# Patient Record
Sex: Female | Born: 1960 | Marital: Married | Smoking: Never smoker
Health system: Southern US, Community
[De-identification: ages and names within clinical notes are randomized; demographics above are authoritative.]

## PROBLEM LIST (undated history)

## (undated) DIAGNOSIS — R51 Headache: Secondary | ICD-10-CM

## (undated) DIAGNOSIS — E8881 Metabolic syndrome: Secondary | ICD-10-CM

## (undated) DIAGNOSIS — M5410 Radiculopathy, site unspecified: Secondary | ICD-10-CM

## (undated) DIAGNOSIS — T8859XA Other complications of anesthesia, initial encounter: Secondary | ICD-10-CM

## (undated) DIAGNOSIS — Z973 Presence of spectacles and contact lenses: Secondary | ICD-10-CM

## (undated) DIAGNOSIS — R519 Headache, unspecified: Secondary | ICD-10-CM

## (undated) DIAGNOSIS — E88819 Insulin resistance, unspecified: Secondary | ICD-10-CM

## (undated) DIAGNOSIS — T4145XA Adverse effect of unspecified anesthetic, initial encounter: Secondary | ICD-10-CM

## (undated) HISTORY — PX: ABDOMINAL HYSTERECTOMY: SHX81

## (undated) HISTORY — PX: COLONOSCOPY: SHX174

## (undated) HISTORY — PX: TONSILLECTOMY: SUR1361

## (undated) HISTORY — PX: DILATION AND CURETTAGE OF UTERUS: SHX78

## (undated) HISTORY — PX: COLON SURGERY: SHX602

## (undated) HISTORY — PX: APPENDECTOMY: SHX54

---

## 2016-11-12 ENCOUNTER — Ambulatory Visit: Payer: Self-pay | Admitting: Physician Assistant

## 2016-11-25 NOTE — Pre-Procedure Instructions (Signed)
    Deborah Molina  11/25/2016      [facility] [phone] - [place], [place] - [address] AT [facility] [address] [name] [id number] Phone: [phone] Fax: [phone]  [facility] - [place], [place] - [address] [place] [id number] Phone: [phone] Fax: [phone]    Your procedure is scheduled on Thursday, December 03, 2016  [facility] Admitting at 5:30 A.M.  Call this number if you have problems the morning of surgery:  [phone]   Remember:  Do not eat food or drink liquids after midnight Wednesday, December 02, 2016  Take these medicines the morning of surgery with A SIP OF WATER :estradiol (ESTRACE), Singulair, Thyroid Stop taking Aspirin, vitamins, fish oil, and herbal medications (Coenzyme Q10,  "a Drenal, " K2 MK-7 MAX,  Berberine, Ashwagandha, Hemp Flower Extract, Pregnenolone, Bromelain  ).  Do not take any NSAIDs ie: Ibuprofen, Advil, Naproxen (Aleve), Motrin, BC and Goody Powder or any medication containing Aspirin; stop now.  Do not wear jewelry, make-up or nail polish.  Do not wear lotions, powders, or perfumes, or deoderant.  Do not shave 48 hours prior to surgery.    Do not bring valuables to the hospital.  [facility] is not responsible for any belongings or valuables.  Contacts, dentures or bridgework may not be worn into surgery.  Leave your suitcase in the car.  After surgery it may be brought to your room. For patients admitted to the hospital, discharge time will be determined by your treatment team. Patients discharged the day of surgery will not be allowed to drive home.  Special instructions: Shower the night before surgery and the morning of surgery with CHG. Please read over the following fact sheets that you were given. Pain Booklet, Coughing and Deep Breathing, MRSA Information and Surgical Site Infection Prevention

## 2016-11-26 ENCOUNTER — Encounter (HOSPITAL_COMMUNITY): Payer: Self-pay

## 2016-11-26 ENCOUNTER — Encounter (HOSPITAL_COMMUNITY)
Admission: RE | Admit: 2016-11-26 | Discharge: 2016-11-26 | Disposition: A | Discharge status: Home / Self Care | Payer: BLUE CROSS/BLUE SHIELD | Source: Ambulatory Visit | Attending: Orthopedic Surgery | Admitting: Orthopedic Surgery

## 2016-11-26 DIAGNOSIS — Z01812 Encounter for preprocedural laboratory examination: Secondary | ICD-10-CM | POA: Diagnosis not present

## 2016-11-26 DIAGNOSIS — Z01810 Encounter for preprocedural cardiovascular examination: Secondary | ICD-10-CM | POA: Diagnosis not present

## 2016-11-26 DIAGNOSIS — E8881 Metabolic syndrome: Secondary | ICD-10-CM | POA: Diagnosis not present

## 2016-11-26 HISTORY — DX: Presence of spectacles and contact lenses: Z97.3

## 2016-11-26 HISTORY — DX: Other complications of anesthesia, initial encounter: T88.59XA

## 2016-11-26 HISTORY — DX: Headache: R51

## 2016-11-26 HISTORY — DX: Metabolic syndrome: E88.81

## 2016-11-26 HISTORY — DX: Radiculopathy, site unspecified: M54.10

## 2016-11-26 HISTORY — DX: Insulin resistance, unspecified: E88.819

## 2016-11-26 HISTORY — DX: Hereditary hemochromatosis: E83.110

## 2016-11-26 HISTORY — DX: Headache, unspecified: R51.9

## 2016-11-26 HISTORY — DX: Adverse effect of unspecified anesthetic, initial encounter: T41.45XA

## 2016-11-26 LAB — CBC
HCT: 43.4 % (ref 36.0–46.0)
HEMOGLOBIN: 14.2 g/dL (ref 12.0–15.0)
MCH: 30 pg (ref 26.0–34.0)
MCHC: 32.7 g/dL (ref 30.0–36.0)
MCV: 91.6 fL (ref 78.0–100.0)
PLATELETS: 309 10*3/uL (ref 150–400)
RBC: 4.74 MIL/uL (ref 3.87–5.11)
RDW: 12.1 % (ref 11.5–15.5)
WBC: 6.9 10*3/uL (ref 4.0–10.5)

## 2016-11-26 LAB — HEMOGLOBIN A1C
Hgb A1c MFr Bld: 5.3 % (ref 4.8–5.6)
Mean Plasma Glucose: 105.41 mg/dL

## 2016-11-26 LAB — BASIC METABOLIC PANEL
Anion gap: 9 (ref 5–15)
BUN: 14 mg/dL (ref 6–20)
CALCIUM: 9.6 mg/dL (ref 8.9–10.3)
CO2: 24 mmol/L (ref 22–32)
CREATININE: 0.87 mg/dL (ref 0.44–1.00)
Chloride: 105 mmol/L (ref 101–111)
GFR calc Af Amer: >60 mL/min (ref >60)
GFR calc non Af Amer: >60 mL/min (ref >60)
GLUCOSE: 121 mg/dL — AB (ref 65–99)
Potassium: 4.1 mmol/L (ref 3.5–5.1)
Sodium: 138 mmol/L (ref 135–145)

## 2016-11-26 LAB — SURGICAL PCR SCREEN
MRSA, PCR: NEGATIVE
Staphylococcus aureus: POSITIVE — AB

## 2016-11-26 NOTE — Progress Notes (Signed)
Anesthesia Chart Review:  Pt is a 56 year old female scheduled for C4-7 ACDF on 12/03/2016 with Venita Lickahari Brooks, MD  - Denies having PCP - [facility] taking numerous supplements  PMH includes:  Hemochromatosis. Never smoker. BMI 34.5.  - Pt reports her thyroid function and adrenal gland function "are a little off" but no medical diagnoses.  Also reports "insulin resistance" but no diagnosis of pre-diabetes or diabetes.    Medications include: Nature-throid (contains porcine T3 and T4), A-Drenal (contains bovine adrenal tissue and several herbs including ginseng), and multiple other supplements.  Pt instructed to stop all supplements and meds now except singulair, estradiol, and Nature-throid.   Preoperative labs reviewed.  HbA1c 5.3, glucose 121  EKG 11/26/16: NSR  If no changes, I anticipate pt can proceed with surgery as scheduled.   Rica Mastngela Almalik Weissberg, FNP-BC [facility]Anesthesiology Phone: [phone] 11/26/2016 3:03 PM

## 2016-11-26 NOTE — Progress Notes (Signed)
Pt denies SOB, chest pain, and being under the care of a cardiologist. Pt state that a stress test was performed > 10 years ago. Pt denies having a cardiac cath and echo. Pt denies having an EKG and chest x ray within the last year. Pt denies having an A1c within the last 60 days. Pt denies recent labs. Rica MastAngela Kabbe, NP,  Anesthesia, advised that an A1c and EKG be obtained since pt has history of insulin resistance. Pt advised to contact PCP/ prescriber regarding replacement for "a drenal " supplement (contains Ginsing) because it must be discontinued pre-operatively for upcoming surgery. Anesthesia made aware of order for Consult (see note).

## 2016-12-02 ENCOUNTER — Encounter (HOSPITAL_COMMUNITY): Payer: Self-pay | Admitting: Anesthesiology

## 2016-12-02 NOTE — Anesthesia Preprocedure Evaluation (Addendum)
Anesthesia Evaluation  Patient identified by MRN, date of birth, ID band Patient awake    Reviewed: Allergy & Precautions, Patient's Chart, lab work & pertinent test results  Airway Mallampati: II  TM Distance: >3 FB Neck ROM: Limited    Dental  (+) Teeth Intact, Dental Advisory Given   Pulmonary neg pulmonary ROS,    breath sounds clear to auscultation       Cardiovascular negative cardio ROS   Rhythm:Regular Rate:Normal     Neuro/Psych  Headaches,  Neuromuscular disease    GI/Hepatic negative GI ROS, Neg liver ROS,   Endo/Other  negative endocrine ROSMorbid obesity  Renal/GU negative Renal ROS     Musculoskeletal negative musculoskeletal ROS (+)   Abdominal   Peds  Hematology negative hematology ROS (+)   Anesthesia Other Findings Day of surgery medications reviewed with the patient.  Reproductive/Obstetrics                            Lab Results  Component Value Date   WBC 6.9 11/26/2016   HGB 14.2 11/26/2016   HCT 43.4 11/26/2016   MCV 91.6 11/26/2016   PLT 309 11/26/2016   Lab Results  Component Value Date   CREATININE 0.87 11/26/2016   BUN 14 11/26/2016   NA 138 11/26/2016   K 4.1 11/26/2016   CL 105 11/26/2016   CO2 24 11/26/2016   No results found for: INR, PROTIME  EKG: NSR  Anesthesia Physical Anesthesia Plan  ASA: II  Anesthesia Plan: General   Post-op Pain Management:    Induction: Intravenous  PONV Risk Score and Plan: 4 or greater and Ondansetron, Dexamethasone, Midazolam, Scopolamine patch - Pre-op and Treatment may vary due to age or medical condition  Airway Management Planned: Oral ETT  Additional Equipment:   Intra-op Plan:   Post-operative Plan: Extubation in OR  Informed Consent: I have reviewed the patients History and Physical, chart, labs and discussed the procedure including the risks, benefits and alternatives for the proposed  anesthesia with the patient or authorized representative who has indicated his/her understanding and acceptance.   Dental advisory given  Plan Discussed with: CRNA  Anesthesia Plan Comments:         Anesthesia Quick Evaluation

## 2016-12-03 ENCOUNTER — Encounter (HOSPITAL_COMMUNITY): Payer: Self-pay | Admitting: *Deleted

## 2016-12-03 ENCOUNTER — Encounter (HOSPITAL_COMMUNITY)
Admission: RE | Disposition: A | Discharge status: Home / Self Care | Payer: Self-pay | Source: Ambulatory Visit | Attending: Orthopedic Surgery

## 2016-12-03 ENCOUNTER — Inpatient Hospital Stay (HOSPITAL_COMMUNITY): Payer: BLUE CROSS/BLUE SHIELD | Admitting: Vascular Surgery

## 2016-12-03 ENCOUNTER — Observation Stay (HOSPITAL_COMMUNITY)
Admission: RE | Admit: 2016-12-03 | Discharge: 2016-12-04 | Disposition: A | Discharge status: Home / Self Care | Payer: BLUE CROSS/BLUE SHIELD | Source: Ambulatory Visit | Attending: Orthopedic Surgery | Admitting: Orthopedic Surgery

## 2016-12-03 ENCOUNTER — Inpatient Hospital Stay (HOSPITAL_COMMUNITY): Payer: BLUE CROSS/BLUE SHIELD

## 2016-12-03 ENCOUNTER — Inpatient Hospital Stay (HOSPITAL_COMMUNITY): Payer: BLUE CROSS/BLUE SHIELD | Admitting: Anesthesiology

## 2016-12-03 DIAGNOSIS — Z79899 Other long term (current) drug therapy: Secondary | ICD-10-CM | POA: Insufficient documentation

## 2016-12-03 DIAGNOSIS — Z79890 Hormone replacement therapy (postmenopausal): Secondary | ICD-10-CM | POA: Insufficient documentation

## 2016-12-03 DIAGNOSIS — M50122 Cervical disc disorder at C5-C6 level with radiculopathy: Secondary | ICD-10-CM | POA: Insufficient documentation

## 2016-12-03 DIAGNOSIS — Z981 Arthrodesis status: Secondary | ICD-10-CM

## 2016-12-03 DIAGNOSIS — Z419 Encounter for procedure for purposes other than remedying health state, unspecified: Secondary | ICD-10-CM

## 2016-12-03 DIAGNOSIS — M542 Cervicalgia: Secondary | ICD-10-CM | POA: Diagnosis present

## 2016-12-03 DIAGNOSIS — Z6834 Body mass index (BMI) 34.0-34.9, adult: Secondary | ICD-10-CM | POA: Insufficient documentation

## 2016-12-03 DIAGNOSIS — M4722 Other spondylosis with radiculopathy, cervical region: Secondary | ICD-10-CM | POA: Diagnosis present

## 2016-12-03 HISTORY — PX: WOUND EXPLORATION: SHX6188

## 2016-12-03 HISTORY — PX: ANTERIOR CERVICAL DECOMP/DISCECTOMY FUSION: SHX1161

## 2016-12-03 SURGERY — ANTERIOR CERVICAL DECOMPRESSION/DISCECTOMY FUSION 3 LEVELS
Anesthesia: General | Site: Neck

## 2016-12-03 MED ORDER — PROMETHAZINE HCL 25 MG/ML IJ SOLN
6.2500 mg | INTRAMUSCULAR | Status: DC | PRN
Start: 2016-12-03 — End: 2016-12-03
  Administered 2016-12-03: 6.25 mg via INTRAVENOUS

## 2016-12-03 MED ORDER — PHENOL 1.4 % MT LIQD
1.0000 | OROMUCOSAL | Status: DC | PRN
  Administered 2016-12-03: 1 via OROMUCOSAL

## 2016-12-03 MED ORDER — SODIUM CHLORIDE 0.9% FLUSH
3.0000 mL | Freq: Two times a day (BID) | INTRAVENOUS | Status: DC
  Administered 2016-12-03: 3 mL via INTRAVENOUS

## 2016-12-03 MED ORDER — DEXAMETHASONE SODIUM PHOSPHATE 4 MG/ML IJ SOLN: 4.0000 mg | Freq: Four times a day (QID) | INTRAMUSCULAR | Status: DC

## 2016-12-03 MED ORDER — METHOCARBAMOL 500 MG PO TABS
ORAL_TABLET | ORAL | Status: AC
  Filled 2016-12-03: qty 1

## 2016-12-03 MED ORDER — OXYCODONE-ACETAMINOPHEN 10-325 MG PO TABS: 1.0000 | ORAL_TABLET | ORAL | 0 refills | Status: AC | PRN

## 2016-12-03 MED ORDER — CEFAZOLIN SODIUM 1 G IJ SOLR
INTRAMUSCULAR | Status: AC
  Filled 2016-12-03: qty 20

## 2016-12-03 MED ORDER — SUCCINYLCHOLINE CHLORIDE 200 MG/10ML IV SOSY
PREFILLED_SYRINGE | INTRAVENOUS | Status: AC
  Filled 2016-12-03: qty 10

## 2016-12-03 MED ORDER — PHENYLEPHRINE 40 MCG/ML (10ML) SYRINGE FOR IV PUSH (FOR BLOOD PRESSURE SUPPORT)
PREFILLED_SYRINGE | INTRAVENOUS | Status: AC
  Filled 2016-12-03: qty 10

## 2016-12-03 MED ORDER — FENTANYL CITRATE (PF) 250 MCG/5ML IJ SOLN
INTRAMUSCULAR | Status: AC
Start: 2016-12-03 — End: 2016-12-03
  Filled 2016-12-03: qty 5

## 2016-12-03 MED ORDER — LACTATED RINGERS IV SOLN
INTRAVENOUS | Status: DC | PRN
  Administered 2016-12-03 (×2): via INTRAVENOUS

## 2016-12-03 MED ORDER — HYDROMORPHONE HCL 1 MG/ML IJ SOLN
INTRAMUSCULAR | Status: AC
  Administered 2016-12-03: 0.5 mg via INTRAVENOUS
  Filled 2016-12-03: qty 1

## 2016-12-03 MED ORDER — MENTHOL 3 MG MT LOZG: 1.0000 | LOZENGE | OROMUCOSAL | Status: DC | PRN

## 2016-12-03 MED ORDER — SUGAMMADEX SODIUM 200 MG/2ML IV SOLN
INTRAVENOUS | Status: AC
  Filled 2016-12-03: qty 2

## 2016-12-03 MED ORDER — SUCCINYLCHOLINE CHLORIDE 20 MG/ML IJ SOLN
INTRAMUSCULAR | Status: DC | PRN
  Administered 2016-12-03: 120 mg via INTRAVENOUS

## 2016-12-03 MED ORDER — DEXAMETHASONE SODIUM PHOSPHATE 10 MG/ML IJ SOLN
INTRAMUSCULAR | Status: AC
  Filled 2016-12-03: qty 1

## 2016-12-03 MED ORDER — ACETAMINOPHEN 10 MG/ML IV SOLN
1000.0000 mg | Freq: Once | INTRAVENOUS | Status: AC
  Administered 2016-12-03: 1000 mg via INTRAVENOUS
  Filled 2016-12-03: qty 100

## 2016-12-03 MED ORDER — ROCURONIUM BROMIDE 100 MG/10ML IV SOLN
INTRAVENOUS | Status: DC | PRN
  Administered 2016-12-03 (×2): 20 mg via INTRAVENOUS
  Administered 2016-12-03: 10 mg via INTRAVENOUS
  Administered 2016-12-03 (×2): 20 mg via INTRAVENOUS
  Administered 2016-12-03: 50 mg via INTRAVENOUS

## 2016-12-03 MED ORDER — CEFAZOLIN SODIUM-DEXTROSE 2-4 GM/100ML-% IV SOLN
2.0000 g | INTRAVENOUS | Status: AC
  Administered 2016-12-03 (×2): 2 g via INTRAVENOUS
  Filled 2016-12-03: qty 100

## 2016-12-03 MED ORDER — ONDANSETRON HCL 4 MG/2ML IJ SOLN
INTRAMUSCULAR | Status: DC | PRN
  Administered 2016-12-03: 4 mg via INTRAVENOUS

## 2016-12-03 MED ORDER — METHOCARBAMOL 500 MG PO TABS: 500.0000 mg | ORAL_TABLET | Freq: Three times a day (TID) | ORAL | 0 refills | Status: AC | PRN

## 2016-12-03 MED ORDER — LACTATED RINGERS IV SOLN: INTRAVENOUS | Status: DC

## 2016-12-03 MED ORDER — THROMBIN 20000 UNITS EX SOLR
CUTANEOUS | Status: DC | PRN
  Administered 2016-12-03: 20 mL via TOPICAL

## 2016-12-03 MED ORDER — ROCURONIUM BROMIDE 10 MG/ML (PF) SYRINGE
PREFILLED_SYRINGE | INTRAVENOUS | Status: AC
  Filled 2016-12-03: qty 5

## 2016-12-03 MED ORDER — MIDAZOLAM HCL 2 MG/2ML IJ SOLN
INTRAMUSCULAR | Status: AC
  Filled 2016-12-03: qty 2

## 2016-12-03 MED ORDER — MORPHINE SULFATE (PF) 4 MG/ML IV SOLN
2.0000 mg | INTRAVENOUS | Status: DC | PRN
Start: 2016-12-03 — End: 2016-12-04
  Administered 2016-12-03: 2 mg via INTRAVENOUS
  Filled 2016-12-03: qty 1

## 2016-12-03 MED ORDER — PROMETHAZINE HCL 25 MG/ML IJ SOLN
INTRAMUSCULAR | Status: AC
  Filled 2016-12-03: qty 1

## 2016-12-03 MED ORDER — PROGESTERONE MICRONIZED 200 MG PO CAPS: 400.0000 mg | ORAL_CAPSULE | Freq: Every day | ORAL | Status: DC

## 2016-12-03 MED ORDER — THROMBIN 20000 UNITS EX SOLR: CUTANEOUS | Status: DC | PRN

## 2016-12-03 MED ORDER — PROPOFOL 10 MG/ML IV BOLUS
INTRAVENOUS | Status: AC
  Filled 2016-12-03: qty 20

## 2016-12-03 MED ORDER — PHENYLEPHRINE HCL 10 MG/ML IJ SOLN
INTRAMUSCULAR | Status: DC | PRN
  Administered 2016-12-03: 80 ug via INTRAVENOUS

## 2016-12-03 MED ORDER — HEMOSTATIC AGENTS (NO CHARGE) OPTIME
TOPICAL | Status: DC | PRN
  Administered 2016-12-03: 2 via TOPICAL

## 2016-12-03 MED ORDER — ESTRADIOL 1 MG PO TABS
1.5000 mg | ORAL_TABLET | Freq: Every day | ORAL | Status: DC
  Administered 2016-12-04: 1.5 mg via ORAL
  Filled 2016-12-03: qty 1.5

## 2016-12-03 MED ORDER — LACTATED RINGERS IV SOLN
INTRAVENOUS | Status: DC | PRN
  Administered 2016-12-03: 07:00:00 via INTRAVENOUS

## 2016-12-03 MED ORDER — CEFAZOLIN SODIUM-DEXTROSE 2-4 GM/100ML-% IV SOLN
2.0000 g | Freq: Three times a day (TID) | INTRAVENOUS | Status: AC
  Administered 2016-12-03 – 2016-12-04 (×2): 2 g via INTRAVENOUS
  Filled 2016-12-03 (×2): qty 100

## 2016-12-03 MED ORDER — POLYETHYLENE GLYCOL 3350 17 G PO PACK: 17.0000 g | PACK | Freq: Every day | ORAL | Status: DC | PRN

## 2016-12-03 MED ORDER — SUGAMMADEX SODIUM 200 MG/2ML IV SOLN
INTRAVENOUS | Status: DC | PRN
  Administered 2016-12-03: 200 mg via INTRAVENOUS

## 2016-12-03 MED ORDER — ONDANSETRON HCL 4 MG/2ML IJ SOLN: 4.0000 mg | Freq: Four times a day (QID) | INTRAMUSCULAR | Status: DC | PRN

## 2016-12-03 MED ORDER — BUPIVACAINE-EPINEPHRINE 0.25% -1:200000 IJ SOLN
INTRAMUSCULAR | Status: DC | PRN
  Administered 2016-12-03: 10 mL

## 2016-12-03 MED ORDER — SODIUM CHLORIDE 0.9% FLUSH: 3.0000 mL | INTRAVENOUS | Status: DC | PRN

## 2016-12-03 MED ORDER — MIDAZOLAM HCL 5 MG/5ML IJ SOLN
INTRAMUSCULAR | Status: DC | PRN
  Administered 2016-12-03: 2 mg via INTRAVENOUS

## 2016-12-03 MED ORDER — DEXAMETHASONE 4 MG PO TABS
4.0000 mg | ORAL_TABLET | Freq: Four times a day (QID) | ORAL | Status: DC
  Administered 2016-12-03 – 2016-12-04 (×3): 4 mg via ORAL
  Filled 2016-12-03 (×3): qty 1

## 2016-12-03 MED ORDER — MEPERIDINE HCL 25 MG/ML IJ SOLN: 6.2500 mg | INTRAMUSCULAR | Status: DC | PRN

## 2016-12-03 MED ORDER — PROGESTERONE MICRONIZED 100 MG PO CAPS
400.0000 mg | ORAL_CAPSULE | Freq: Every day | ORAL | Status: DC
  Administered 2016-12-03: 400 mg via ORAL
  Filled 2016-12-03: qty 4

## 2016-12-03 MED ORDER — OXYCODONE HCL 5 MG PO TABS
10.0000 mg | ORAL_TABLET | ORAL | Status: DC | PRN
  Administered 2016-12-03 – 2016-12-04 (×5): 10 mg via ORAL
  Filled 2016-12-03 (×4): qty 2

## 2016-12-03 MED ORDER — ONDANSETRON HCL 4 MG PO TABS: 4.0000 mg | ORAL_TABLET | Freq: Four times a day (QID) | ORAL | Status: DC | PRN

## 2016-12-03 MED ORDER — LIDOCAINE HCL (CARDIAC) 20 MG/ML IV SOLN
INTRAVENOUS | Status: DC | PRN
  Administered 2016-12-03: 50 mg via INTRAVENOUS

## 2016-12-03 MED ORDER — DEXAMETHASONE SODIUM PHOSPHATE 10 MG/ML IJ SOLN
INTRAMUSCULAR | Status: DC | PRN
  Administered 2016-12-03: 10 mg via INTRAVENOUS

## 2016-12-03 MED ORDER — ACETAMINOPHEN 650 MG RE SUPP
650.0000 mg | RECTAL | Status: DC | PRN
Start: 2016-12-03 — End: 2016-12-04

## 2016-12-03 MED ORDER — ONDANSETRON HCL 4 MG/2ML IJ SOLN
INTRAMUSCULAR | Status: AC
  Filled 2016-12-03: qty 2

## 2016-12-03 MED ORDER — LIDOCAINE 2% (20 MG/ML) 5 ML SYRINGE
INTRAMUSCULAR | Status: AC
  Filled 2016-12-03: qty 5

## 2016-12-03 MED ORDER — METHOCARBAMOL 1000 MG/10ML IJ SOLN
500.0000 mg | Freq: Four times a day (QID) | INTRAMUSCULAR | Status: DC | PRN
  Filled 2016-12-03: qty 5

## 2016-12-03 MED ORDER — THROMBIN 20000 UNITS EX SOLR
CUTANEOUS | Status: AC
  Filled 2016-12-03: qty 20000

## 2016-12-03 MED ORDER — OXYCODONE HCL 5 MG PO TABS
ORAL_TABLET | ORAL | Status: AC
  Filled 2016-12-03: qty 2

## 2016-12-03 MED ORDER — BUPIVACAINE-EPINEPHRINE (PF) 0.25% -1:200000 IJ SOLN
INTRAMUSCULAR | Status: AC
  Filled 2016-12-03: qty 30

## 2016-12-03 MED ORDER — ACETAMINOPHEN 325 MG PO TABS: 650.0000 mg | ORAL_TABLET | ORAL | Status: DC | PRN

## 2016-12-03 MED ORDER — HYDROMORPHONE HCL 1 MG/ML IJ SOLN
0.2500 mg | INTRAMUSCULAR | Status: DC | PRN
  Administered 2016-12-03 (×4): 0.5 mg via INTRAVENOUS

## 2016-12-03 MED ORDER — ONDANSETRON HCL 4 MG PO TABS: 4.0000 mg | ORAL_TABLET | Freq: Three times a day (TID) | ORAL | 0 refills | Status: AC | PRN

## 2016-12-03 MED ORDER — FENTANYL CITRATE (PF) 100 MCG/2ML IJ SOLN
INTRAMUSCULAR | Status: DC | PRN
  Administered 2016-12-03: 50 ug via INTRAVENOUS
  Administered 2016-12-03: 100 ug via INTRAVENOUS
  Administered 2016-12-03 (×4): 50 ug via INTRAVENOUS

## 2016-12-03 MED ORDER — FENTANYL CITRATE (PF) 250 MCG/5ML IJ SOLN
INTRAMUSCULAR | Status: AC
  Filled 2016-12-03: qty 5

## 2016-12-03 MED ORDER — PROPOFOL 10 MG/ML IV BOLUS
INTRAVENOUS | Status: DC | PRN
  Administered 2016-12-03: 140 mg via INTRAVENOUS

## 2016-12-03 MED ORDER — METHOCARBAMOL 500 MG PO TABS
500.0000 mg | ORAL_TABLET | Freq: Four times a day (QID) | ORAL | Status: DC | PRN
  Administered 2016-12-03 – 2016-12-04 (×4): 500 mg via ORAL
  Filled 2016-12-03 (×3): qty 1

## 2016-12-03 MED ORDER — HEMOSTATIC AGENTS (NO CHARGE) OPTIME
TOPICAL | Status: DC | PRN
  Administered 2016-12-03: 1 via TOPICAL

## 2016-12-03 SURGICAL SUPPLY — 77 items
BLADE CLIPPER SURG (BLADE) IMPLANT
BONE VIVIGEN FORMABLE 5.4CC (Bone Implant) ×4 IMPLANT
BUR EGG ELITE 4.0 (BURR) IMPLANT
BUR EGG ELITE 4.0MM (BURR)
BUR MATCHSTICK NEURO 3.0 LAGG (BURR) IMPLANT
CANISTER SUCT 3000ML PPV (MISCELLANEOUS) ×4 IMPLANT
CLIP SWIFT PLUS FIXATION 1MM (MISCELLANEOUS) ×6 IMPLANT
CLOSURE STERI-STRIP 1/2X4 (GAUZE/BANDAGES/DRESSINGS) ×2
CLSR STERI-STRIP ANTIMIC 1/2X4 (GAUZE/BANDAGES/DRESSINGS) ×6 IMPLANT
CORDS BIPOLAR (ELECTRODE) ×4 IMPLANT
COVER SURGICAL LIGHT HANDLE (MISCELLANEOUS) ×12 IMPLANT
CRADLE DONUT ADULT HEAD (MISCELLANEOUS) ×4 IMPLANT
DEVICE ENDSKLTN IMPL 16X14X7X6 (Cage) ×6 IMPLANT
DRAIN TLS ROUND 10FR (DRAIN) ×8 IMPLANT
DRAPE C-ARM 42X72 X-RAY (DRAPES) ×4 IMPLANT
DRAPE POUCH INSTRU U-SHP 10X18 (DRAPES) ×4 IMPLANT
DRAPE SURG 17X23 STRL (DRAPES) ×4 IMPLANT
DRAPE U-SHAPE 47X51 STRL (DRAPES) ×4 IMPLANT
DRILL BIT SWIFT PLUS 12MM (BIT) ×4 IMPLANT
DRSG MEPILEX BORDER 4X8 (GAUZE/BANDAGES/DRESSINGS) ×4 IMPLANT
DURAPREP 6ML APPLICATOR 50/CS (WOUND CARE) ×4 IMPLANT
ELECT COATED BLADE 2.86 ST (ELECTRODE) ×8 IMPLANT
ELECT PENCIL ROCKER SW 15FT (MISCELLANEOUS) ×4 IMPLANT
ELECT REM PT RETURN 9FT ADLT (ELECTROSURGICAL) ×4
ELECTRODE REM PT RTRN 9FT ADLT (ELECTROSURGICAL) ×2 IMPLANT
ENDOSKELETON IMPLANT 16X14X7X6 (Cage) ×12 IMPLANT
GAUZE SPONGE 4X4 12PLY STRL (GAUZE/BANDAGES/DRESSINGS) ×4 IMPLANT
GLOVE BIO SURGEON STRL SZ 6.5 (GLOVE) ×3 IMPLANT
GLOVE BIO SURGEONS STRL SZ 6.5 (GLOVE) ×1
GLOVE BIOGEL PI IND STRL 6.5 (GLOVE) ×2 IMPLANT
GLOVE BIOGEL PI IND STRL 8.5 (GLOVE) ×2 IMPLANT
GLOVE BIOGEL PI INDICATOR 6.5 (GLOVE) ×2
GLOVE BIOGEL PI INDICATOR 8.5 (GLOVE) ×2
GLOVE SS BIOGEL STRL SZ 8.5 (GLOVE) ×2 IMPLANT
GLOVE SUPERSENSE BIOGEL SZ 8.5 (GLOVE) ×2
GOWN STRL REUS W/ TWL XL LVL3 (GOWN DISPOSABLE) ×4 IMPLANT
GOWN STRL REUS W/TWL 2XL LVL3 (GOWN DISPOSABLE) ×8 IMPLANT
GOWN STRL REUS W/TWL XL LVL3 (GOWN DISPOSABLE) ×4
HEMOSTAT SPONGE AVITENE ULTRA (HEMOSTASIS) ×4 IMPLANT
KIT BASIN OR (CUSTOM PROCEDURE TRAY) ×8 IMPLANT
KIT ROOM TURNOVER OR (KITS) ×4 IMPLANT
MATRIX HEMOSTAT SURGIFLO (HEMOSTASIS) ×4 IMPLANT
NEEDLE SPNL 18GX3.5 QUINCKE PK (NEEDLE) ×4 IMPLANT
NS IRRIG 1000ML POUR BTL (IV SOLUTION) ×4 IMPLANT
PACK ORTHO CERVICAL (CUSTOM PROCEDURE TRAY) ×8 IMPLANT
PACK UNIVERSAL I (CUSTOM PROCEDURE TRAY) ×8 IMPLANT
PAD ARMBOARD 7.5X6 YLW CONV (MISCELLANEOUS) ×12 IMPLANT
PATTIES SURGICAL .25X.25 (GAUZE/BANDAGES/DRESSINGS) ×4 IMPLANT
PATTIES SURGICAL .5 X.5 (GAUZE/BANDAGES/DRESSINGS) IMPLANT
PIN DISTRACTION 14 (PIN) ×4 IMPLANT
PLATE SWIFT 3LVL 48MM (Plate) ×4 IMPLANT
RESTRAINT LIMB HOLDER UNIV (RESTRAINTS) ×4 IMPLANT
SCREW SD-VA 14M SWIFT PLUS (Screw) ×16 IMPLANT
SCREW SWIFT SD-VA 12MM (Screw) ×16 IMPLANT
SPONGE INTESTINAL PEANUT (DISPOSABLE) ×8 IMPLANT
SPONGE LAP 4X18 X RAY DECT (DISPOSABLE) ×8 IMPLANT
SPONGE SURGIFOAM ABS GEL 100 (HEMOSTASIS) ×4 IMPLANT
SUCTION FRAZIER TIP 10 FR DISP (SUCTIONS) ×4 IMPLANT
SURGIFLO W/THROMBIN 8M KIT (HEMOSTASIS) ×4 IMPLANT
SUT BONE WAX W31G (SUTURE) ×4 IMPLANT
SUT MON AB 3-0 SH 27 (SUTURE) ×4
SUT MON AB 3-0 SH27 (SUTURE) ×4 IMPLANT
SUT SILK 2 0 (SUTURE) ×2
SUT SILK 2-0 18XBRD TIE 12 (SUTURE) ×2 IMPLANT
SUT VIC AB 2-0 CT1 18 (SUTURE) ×8 IMPLANT
SWIFT CLIP PLUS FIXATION 1MM (MISCELLANEOUS) ×12
SYR BULB IRRIGATION 50ML (SYRINGE) ×4 IMPLANT
SYR CONTROL 10ML LL (SYRINGE) ×4 IMPLANT
TAPE CLOTH 4X10 WHT NS (GAUZE/BANDAGES/DRESSINGS) ×4 IMPLANT
TAPE CLOTH SURG 4X10 WHT LF (GAUZE/BANDAGES/DRESSINGS) ×4 IMPLANT
TAPE UMBILICAL COTTON 1/8X30 (MISCELLANEOUS) ×4 IMPLANT
TOWEL OR 17X24 6PK STRL BLUE (TOWEL DISPOSABLE) ×8 IMPLANT
TOWEL OR 17X26 10 PK STRL BLUE (TOWEL DISPOSABLE) ×4 IMPLANT
TRAY FOLEY W/METER SILVER 16FR (SET/KITS/TRAYS/PACK) ×4 IMPLANT
TUBE EVACUATION TLS (MISCELLANEOUS) ×4 IMPLANT
WATER STERILE IRR 1000ML POUR (IV SOLUTION) ×4 IMPLANT
YANKAUER SUCT BULB TIP NO VENT (SUCTIONS) ×4 IMPLANT

## 2016-12-03 NOTE — Anesthesia Procedure Notes (Addendum)
Procedure Name: Intubation Date/Time: 12/03/2016 7:46 AM Performed by: Lovie CholOCK, Raeshawn Tafolla K Pre-anesthesia Checklist: Patient identified, Emergency Drugs available, Suction available and Patient being monitored Patient Re-evaluated:Patient Re-evaluated prior to induction Oxygen Delivery Method: Circle System Utilized Preoxygenation: Pre-oxygenation with 100% oxygen Induction Type: IV induction Ventilation: Mask ventilation without difficulty Grade View: Grade I Tube type: Oral Tube size: 7.0 mm Number of attempts: 1 Airway Equipment and Method: Stylet and Video-laryngoscopy Placement Confirmation: ETT inserted through vocal cords under direct vision,  positive ETCO2 and breath sounds checked- equal and bilateral Secured at: 22 cm Tube secured with: Tape Dental Injury: Teeth and Oropharynx as per pre-operative assessment  Comments: Elective video-glide. Head/neck maintained in neutral position during induction and intubation.

## 2016-12-03 NOTE — Discharge Instructions (Signed)

## 2016-12-03 NOTE — Anesthesia Postprocedure Evaluation (Signed)
Anesthesia Post Note  Patient: Deborah Molina  Procedure(s) Performed: Procedure(s) (LRB): ACDF C4-7 (N/A) WOUND EXPLORATION     Patient location during evaluation: PACU Anesthesia Type: General Level of consciousness: awake and alert Pain management: pain level controlled Vital Signs Assessment: post-procedure vital signs reviewed and stable Respiratory status: spontaneous breathing, nonlabored ventilation, respiratory function stable and patient connected to nasal cannula oxygen Cardiovascular status: blood pressure returned to baseline and stable Postop Assessment: no apparent nausea or vomiting Anesthetic complications: no    Last Vitals:  Vitals:   12/03/16 1445 12/03/16 1500  BP: (!) 125/59 (!) 119/58  Pulse: 71 71  Resp: 14 12  Temp:    SpO2: 92% 94%    Last Pain:  Vitals:   12/03/16 1415  TempSrc:   PainSc: 5                  Shelton SilvasKevin D Chesney Klimaszewski

## 2016-12-03 NOTE — Brief Op Note (Signed)
12/03/2016  12:37 PM  PATIENT:  Deborah Molina  56 y.o. female  PRE-OPERATIVE DIAGNOSIS:  3 level cervical spondylotic radiculopathy  POST-OPERATIVE DIAGNOSIS:  3 level cervical spondylotic radiculopathy   PROCEDURE:  Procedure(s) with comments: ACDF C4-7 (N/A) - 3.5 hrs WOUND EXPLORATION - exploration of anterior cervical discectomy and fusion surgical incision  SURGEON:  Surgeon(s) and Role:    Venita Lick* Omolola Mittman, MD - Primary  PHYSICIAN ASSISTANT:   ASSISTANTS: none   ANESTHESIA:   general  EBL:  Total I/O In: 1700 [I.V.:1700] Out: 482 [Urine:300; Blood:182]  BLOOD ADMINISTERED:none  DRAINS: (1) TLS Drain(s) to suction in the neck   LOCAL MEDICATIONS USED:  MARCAINE     SPECIMEN:  No Specimen  DISPOSITION OF SPECIMEN:  N/A  COUNTS:  YES  TOURNIQUET:  * No tourniquets in log *  DICTATION: .Dragon Dictation  PLAN OF CARE: Admit for overnight observation  PATIENT DISPOSITION:  PACU - hemodynamically stable.

## 2016-12-03 NOTE — H&P (Signed)
History of Present Illness  The patient is a 56 year old female who comes in today for a preoperative History and Physical. The patient is scheduled for a ACDF C4-7 to be performed by Dr. Debria Garretahari D. Shon BatonBrooks, MD at [facility] on 12/03/16. The pt reports a hx of good health. Pt had a previous thyroidectomy.   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 Allergies Reconciled   Family History Cancer  Father. Chronic Obstructive Lung Disease  Mother. Diabetes Mellitus  Brother, Maternal Grandmother, Mother. Liver Disease, Chronic  Brother, Father. Osteoarthritis  Mother. Severe allergy  Paternal Grandmother.  Social History  Tobacco / smoke exposure  Tobacco use  Never smoker. Children  0 Current drinker  08/16/2015: Currently drinks hard liquor less than 5 times per week Current work status  working full time Exercise  Exercises weekly; does other and gym / [employer] Living situation  live with spouse Marital status  married No history of drug/alcohol rehab  Not under pain contract   Medication History Ibuprofen (prn) Active. Progesterone Micronized (100MG  Capsule, Oral) Active. (400mg  qd) Montelukast Sodium (10MG  Tablet, Oral) Active. (qd) Vitamin D (1000UNIT Tablet, Oral) Active. (qd) Vitamin B Complex (Oral) Active. (qd) Vitamin K2 (40MCG Tablet, Oral) Active. (qd) Multi Vitamin Daily (Oral) Active. (qd) Adrenal (Oral) Active. (qd) ZyrTEC Allergy (10MG  Capsule, Oral) Active. (qd) Berberine Complex (200-200-50MG  Capsule, Oral) Active. (qd) Estradiol (1.5MG  Tablet, Oral) Active.  (qd) Testosterone (0.2% Cream, Transdermal) Active. (1/4 to 1/2 ml qd) Medications Reconciled    Physical Exam General General Appearance-Not in acute distress. Orientation-Oriented X3. Build & Nutrition-Well nourished and Well developed.  Integumentary General Characteristics Surgical Scars - no surgical scar evidence of previous cervical surgery. Cervical Spine-Skin examination of the cervical spine is without deformity, skin lesions, lacerations or abrasions.  Chest and Lung Exam Auscultation Breath sounds - Normal and Clear.  Cardiovascular Auscultation Rhythm - Regular rate and rhythm.  Peripheral Vascular Upper Extremity Palpation - Radial pulse - Bilateral - 2+.  Neurologic Sensation Upper Extremity - Left - sensation is diminished in the upper extremity. Right - sensation is intact in the upper extremity. Reflexes Biceps Reflex - Bilateral - 2+. Brachioradialis Reflex - Bilateral - 2+. Triceps Reflex - Bilateral - 2+. Hoffman's Sign - Bilateral - Hoffman's sign not present.  Musculoskeletal Spine/Ribs/Pelvis  Cervical Spine : Inspection and Palpation - Tenderness - right cervical paraspinals tender to palpation and left cervical paraspinals tender to palpation. Strength and Tone: Strength: Strength: Strength - Deltoid - Bilateral - 5/5. Right - 5/5. Biceps - Left - 4-/5. Triceps - Left - 4-/5. Right - 5/5. Wrist Extension - Bilateral - 5/5. Hand Grip - Bilateral - 5/5. Heel walk - Bilateral - able to heel walk without difficulty. Toe Walk - Bilateral - able to walk on toes without difficulty. Heel-Toe Walk - Bilateral - able to heel-toe walk without difficulty. ROM - Flexion - Moderately Decreased and painful. Extension - Moderately Decreased and painful. Left Lateral Flexion - Moderately Decreased and painful. Right Lateral Flexion - Moderately Decreased and painful. Left Rotation - Moderately Decreased and painful. Right Rotation - Moderately Decreased and  painful. Pain - neither flexion or extension is more painful than  the other. Cervical Spine - Special Testing - axial compression test negative, cross chest impingement test negative. Non-Anatomic Signs - No non-anatomic signs present. Upper Extremity Range of Motion - No truesholder pain with IR/ER of the shoulders.  Cervical MRI from 06/18/2016. Demonstrates significant left foraminal stenosis secondary to hard disc osteophyte C4-5. Severe bilateral foraminal stenosis C5/6.  Moderate foraminal stenosis left side C6/7. No cord signal changes. Positive nerve contact and compression C5, C6, and C7  Assessment & Plan   Goal Of Surgery: Discussed that goal of surgery is to reduce pain and improve function and quality of life. Patient is aware that despite all appropriate treatment that there pain and function could be the same, worse, or different.  Anterior cervical fusion:Risks of surgery include, but are not limited to: Throat pain, swallowing difficulty, hoarseness or change in voice, death, stroke, paralysis, nerve root damage/injury, bleeding, blood clots, loss of bowel/bladder control, hardware failure, or mal-position, spinal fluid leak, adjacent segment disease, non-union, need for further surgery, ongoing or worse pain, infection. Post-operative bleeding or swelling that could require emergent surgery.   Clinically, she has numbness and pain in the C5, C6, and C7 dermatomes. She has got trace weakness of the biceps, triceps, and to a lesser degree deltoid all on the left side. Grip strength is intact. Wrist extensor strength on that left side is intact. She has ongoing neck pain, occipital headaches, and pain with range of motion of the cervical spine. No loss in bowel and bladder control. Negative Babinski test, negative Hoffmann's sign. At this point in time, despite injection therapy and physical therapy completed on 10/01/2016, she continues to deteriorate. At this point in time because of  her ongoing symptoms, we are going to move forward with surgery. Given the fact that she now has C7 radicular symptoms, I would recommend moving forward with a three-level ACDF. This would address at C5, C6, and now new C7 radicular symptoms.

## 2016-12-03 NOTE — Op Note (Signed)
Operative note.  Preoperative diagnosis. Cervical spondylitic radiculopathy C4-5, C5-6, C6-7.  Postoperative diagnosis same.  Operative procedure. ACDF C4-7.  Complications. Upon closure noted that the drain had increased output. Total of 24 mL. Elected to to reprep and drape reexplore before leaving OR. Patient had remained intubated. Upon reexploration there was no active bleeding noted. Small vessels were coagulated. The wound was closed the second time. Drain output this time was minimal.  Implant system used. Titan nano lock intervertebral cages. 7 mm lordotic medium. vivogen allograft.  Depew translational cervical plate. Affixed with 14 mm screws at C4 and C7. 12 mm screws at C5 and C6.  Condition. Stable.  Indications. This is a very pleasant 56 year old severe radicular left arm pain and neck pain. Attempts at conservative management only provided temporary relief. Because of the progressive loss and quality of life and significant pain we elected to proceed with surgery. All appropriate risks benefits and alternatives were discussed with the patient and consent was obtained.  Operative note. Patient was brought the operating room placed on the operating room table. After successful induction of general anesthesia and endotracheal intubation teds SCDs and a Foley were inserted. Gel roll were placed underneath the shoulder blades and the arms were tucked at the side. Cervical spine was then prepped and draped in a standard fashion. Timeout was taken to confirm patient procedure and all other important data.  Fluoroscopic views were taken to identify the C4 and C7 vertebral bodies. I marked out the incision site and infiltrated with quarter percent Marcaine with epinephrine. A longitudinal incision was made on the left lateral aspect of the neck spanning the C4-7 vertebral body levels. This was a standard Clementeen GrahamSmith Robinson approach to the anterior cervical spine. Sharp dissection was carried  out down to the platysma and the platysma was incised in line with incision. I sharply dissected down the medial border of sternocleidomastoid. I then at L5 the omohyoid muscle and sacrificed for visualization I continued to sharply dissect through the remaining deep cervical and prevertebral fascia until I could visualize the anterior longitudinal ligament. I mobilized the esophagus from the inferior portion of C3 to the inferior portion of T1.  This was done so that there would be minimal tension history retraction. A needle was placed into the 45 disc space and an x-ray was taken to confirm I was at the appropriate level. Once this was done I used bipolar cautery to mobilized the longus coli muscle from the mid body of C4 to the mid body of C7. Self-retaining retractors were placed underneath the longus coli muscle the endotracheal cuff was deflated and extended retractor to the appropriate width. The endotracheal cuff was reinflated.   15 blade scalpel was then used to excise the C6-7 annulus and then pituitary rongeurs were used to resect the bulk of the disc material. 2 mm Kerrison was used to resect the osteophyte from the inferior edge of the C5-C6 vertebral body. Distraction pins were then placed into the body of C6 and 7 and I distracted intervertebral space and maintain the distraction pins I then used curettes and Kerrison rongeurs to remove the remaining disc material. A nerve hook was used to dissect through the posterior longitudinal ligament then I used my 1 mm Kerrison to resect the posterior longitudinal ligament. This allowed me to undercut the uncovertebral joint and adequately decompress and excised the bone spur that was causing foraminal stenosis. Once this was done I rasped the endplates and irrigated copes of normal  saline. There is no active bleeding noted. I then measured and placed the size 7 Titan nano lock cage packed with the allograft into place. This was a excellent fit. I then  turned my attention to the C5-6 disc space. Using the exact same technique I used C6-7 performed a C5-6 discectomy. Again the posterior longitudinal ligament was taken down and I remove the osteophytes from  the uncovertebral joints.  I'm the (coronary was a large osteophyte hard disc that was excised consistent with what I saw on the MRI. Once I had adequate decompression I then lastly endplates and placed the same size  At this level. I then turned my attention to the final C4-5 level.  The discectomy was performed in the same fashion as I performed at the other 2 levels. Again the PLL was resected with the 1 mm Kerrison and I decompressed the uncovertebral osteophytes and make sure that adequate central decompression as well. The same size graft was malleted into position. At this point x-rays demonstrated satisfactory position of all 3 intervertebral cages.  The Depew translational plate was obtained. This is a 48 mm in length. I then secured into the vertebral body with a temporary set pen and then drilled and placed a 14 mm screws at C4 and see C7. All 4 screws had excellent purchase. I confirmed with x-ray satisfactory positioning and then I placed the C5 and C6 screws. These were 12 mm in length. All screws were then tightened down according manufacturer's standards. I then removed the locking translational tabs. At this point I irrigated the wound copes of normal saline and made sure hemostasis. I then placed a drain and brought out through a separate stab incision and then closed the platysma with interrupted 2-0 Vicryl sutures and the skin with 3-0 Monocryl. Dry dressings were applied and then I then and the drapes were removed. Is at this point time that I realized that the output from the drain was somewhat concerning. There is about 24 mL of blood in the drain. Because of this I felt as though I should reexplore to ensure there was no bleeding to prevent a postoperative hematoma and potential  airway, patient. Since the patient had not been extubated and was still intubated I reprepped and draped the cervical spine. I removed the drain took out the sutures and explored the wound. I irrigated copiously normal saline. Once the irrigant came out right clear there was no evidence of any active bleeding. There was some small bleeding and from the surrounding musculature which is coagulated with bipolar. At this point I then watch the wound for about 5 minutes was no active bleeding. I placed another drain and then closed the platysma and then connected the drain and monitored the drain output for approximately 5 minutes. Again there was no significant bleeding. At this point I closed the skin with 3-0 Monocryl Steri-Strips dry dressing were applied and the patient was extubated transferred the PACU that incident. The end of the case all needle sponge counts were correct. In the PACU the patient had no significant further output from the drain and there was no deviation of the trachea. The patient was resting comfortably. All needle sponge counts were correct.

## 2016-12-03 NOTE — Transfer of Care (Signed)
Immediate Anesthesia Transfer of Care Note  Patient: Deborah Molina  Procedure(s) Performed: Procedure(s) with comments: ACDF C4-7 (N/A) - 3.5 hrs WOUND EXPLORATION - exploration of anterior cervical discectomy and fusion surgical incision  Patient Location: PACU  Anesthesia Type:General  Level of Consciousness: awake, oriented and patient cooperative  Airway & Oxygen Therapy: Patient Spontanous Breathing and Patient connected to nasal cannula oxygen  Post-op Assessment: Report given to RN and Post -op Vital signs reviewed and stable  Post vital signs: Reviewed  Last Vitals:  Vitals:   12/03/16 0625 12/03/16 1245  BP: 133/75 (!) 101/42  Pulse: 78 90  Resp: 16 (!) 22  Temp: 36.8 C 36.7 C  SpO2: 98% 94%    Last Pain:  Vitals:   12/03/16 0625  TempSrc: Oral  PainSc:       Patients Stated Pain Goal: 2 (12/03/16 [id number])  Complications: No apparent anesthesia complications

## 2016-12-03 NOTE — Evaluation (Signed)
Physical Therapy Evaluation Patient Details Name: Deborah Molina MRN: [id number] DOB: June 03, 1960 Today's Date: 12/03/2016   History of Present Illness  Pt is a 56 y/o female s/p C4-7 ACDF. Pt with no pertinent PMH.   Clinical Impression  Patient is s/p above surgery resulting in the deficits listed below (see PT Problem List). PTA, pt was independent with functional mobility. Upon eval, pt presenting with post op pain and slightly decreased balance. Required min guard for mobility this session. Pt reports husband can assist as needed upon d/c. Patient will benefit from skilled PT to increase their independence and safety with mobility (while adhering to their precautions) to allow discharge to the venue listed below. Will continue to follow acutely.      Follow Up Recommendations No PT follow up;Supervision for mobility/OOB    Equipment Recommendations  None recommended by PT    Recommendations for Other Services       Precautions / Restrictions Precautions Precautions: Cervical Precaution Comments: Reviewed cervical precaution handout with pt.  Required Braces or Orthoses: Cervical Brace Cervical Brace: Hard collar Restrictions Weight Bearing Restrictions: No      Mobility  Bed Mobility Overal bed mobility: Needs Assistance Bed Mobility: Supine to Sit;Sit to Supine     Supine to sit: Supervision Sit to supine: Supervision   General bed mobility comments: Supervision for safety. Educated about use of log roll to maintain precautions.   Transfers Overall transfer level: Needs assistance Equipment used: None Transfers: Sit to/from Stand Sit to Stand: Min guard         General transfer comment: Min guard for safety.   Ambulation/Gait Ambulation/Gait assistance: Min guard Ambulation Distance (Feet): 300 Feet Assistive device: None Gait Pattern/deviations: Step-through pattern;Decreased stride length Gait velocity: Decreased Gait velocity interpretation: Below  normal speed for age/gender General Gait Details: Slow, slightly unsteady gait. Min guard for steadying assist. Verbal cues for appropriate posture during gait. Some nausea reported during ambulation, however, did not limit tolerance.   Stairs            Wheelchair Mobility    Modified Rankin (Stroke Patients Only)       Balance Overall balance assessment: Needs assistance Sitting-balance support: No upper extremity supported;Feet supported Sitting balance-Leahy Scale: Good     Standing balance support: No upper extremity supported;During functional activity Standing balance-Leahy Scale: Fair Standing balance comment: Mild unsteadiness, however, no LOB noted.                              Pertinent Vitals/Pain Pain Assessment: 0-10 Pain Score: 6  Pain Location: neck  Pain Descriptors / Indicators: Aching;Operative site guarding Pain Intervention(s): RN gave pain meds during session;Limited activity within patient's tolerance;Monitored during session;Repositioned    Home Living Family/patient expects to be discharged to:: Private residence Living Arrangements: Spouse/significant other Available Help at Discharge: Family;Available 24 hours/day Type of Home: House Home Access: Stairs to enter Entrance Stairs-Rails: None Entrance Stairs-Number of Steps: 1 (threshold step ) Home Layout: Two level Home Equipment: Shower seat - built in;Hand held shower head Additional Comments: Has two houses one they sleep in and one where they go during the day. About 200 feet to house behind.     Prior Function Level of Independence: Independent               Hand Dominance   Dominant Hand: Left    Extremity/Trunk Assessment   Upper Extremity Assessment Upper Extremity Assessment: Defer  to OT evaluation    Lower Extremity Assessment Lower Extremity Assessment: Overall WFL for tasks assessed    Cervical / Trunk Assessment Cervical / Trunk Assessment: Other  exceptions Cervical / Trunk Exceptions: s/p ACDF  Communication   Communication: No difficulties  Cognition Arousal/Alertness: Awake/alert Behavior During Therapy: WFL for tasks assessed/performed Overall Cognitive Status: Within Functional Limits for tasks assessed                                        General Comments General comments (skin integrity, edema, etc.): Educated about generealized walking program to perform at home.     Exercises     Assessment/Plan    PT Assessment Patient needs continued PT services  PT Problem List Decreased balance;Decreased mobility;Decreased knowledge of precautions;Pain       PT Treatment Interventions Gait training;Stair training;Functional mobility training;Therapeutic activities;Therapeutic exercise;Balance training;Neuromuscular re-education;Patient/family education    PT Goals (Current goals can be found in the Care Plan section)  Acute Rehab PT Goals Patient Stated Goal: to go home  PT Goal Formulation: With patient Time For Goal Achievement: 12/10/16 Potential to Achieve Goals: Good    Frequency Min 5X/week   Barriers to discharge        Co-evaluation               AM-PAC PT "6 Clicks" Daily Activity  Outcome Measure Difficulty turning over in bed (including adjusting bedclothes, sheets and blankets)?: None Difficulty moving from lying on back to sitting on the side of the bed? : None Difficulty sitting down on and standing up from a chair with arms (e.g., wheelchair, bedside commode, etc,.)?: Unable Help needed moving to and from a bed to chair (including a wheelchair)?: A Little Help needed walking in hospital room?: A Little Help needed climbing 3-5 steps with a railing? : A Little 6 Click Score: 18    End of Session Equipment Utilized During Treatment: Gait belt;Cervical collar Activity Tolerance: Patient tolerated treatment well Patient left: in bed;with call bell/phone within reach Nurse  Communication: Mobility status PT Visit Diagnosis: Other abnormalities of gait and mobility (R26.89);Unsteadiness on feet (R26.81);Pain Pain - part of body:  (neck )    Time: [id number] PT Time Calculation (min) (ACUTE ONLY): 25 min   Charges:   PT Evaluation $PT Eval Low Complexity: 1 Low PT Treatments $Gait Training: 8-22 mins   PT G Codes:   PT G-Codes **NOT FOR INPATIENT CLASS** Functional Assessment Tool Used: AM-PAC 6 Clicks Basic Mobility Functional Limitation: Mobility: Walking and moving around Mobility: Walking and Moving Around Current Status ([id number]([id number]): At least 40 percent but less than 60 percent impaired, limited or restricted Mobility: Walking and Moving Around Goal Status [phone](G8979): At least 1 percent but less than 20 percent impaired, limited or restricted    Gladys DammeBrittany Prestyn Stanco, PT, DPT  Acute Rehabilitation Services  Pager: [phone]   Lehman PromBrittany S Shakevia Sarris 12/03/2016, 6:35 PM

## 2016-12-04 DIAGNOSIS — M4722 Other spondylosis with radiculopathy, cervical region: Secondary | ICD-10-CM | POA: Diagnosis not present

## 2016-12-04 NOTE — Progress Notes (Signed)
Patient is discharged from room 3C09 at this time. Alert and in stable condition. IV site d/c'd and instructions read to patient and husband with understanding verbalized. Left unit via wheelchair with all belongings at side. 

## 2016-12-04 NOTE — Progress Notes (Signed)
Physical Therapy Treatment Patient Details Name: Deborah Molina MRN: [id number] DOB: 06-23-1960 Today's Date: 12/04/2016    History of Present Illness Pt is a 56 y/o female s/p C4-7 ACDF. Pt with no pertinent PMH.     PT Comments    Pt making excellent progress with mobility and successfully completed stair training this session. PT will continue to follow acutely to ensure a safe d/c home.   Follow Up Recommendations  No PT follow up;Supervision for mobility/OOB     Equipment Recommendations  None recommended by PT    Recommendations for Other Services       Precautions / Restrictions Precautions Precautions: Cervical Precaution Comments: Reviewed cervical precaution handout with pt.  Required Braces or Orthoses: Cervical Brace Cervical Brace: Hard collar Restrictions Weight Bearing Restrictions: No    Mobility  Bed Mobility Overal bed mobility: Needs Assistance Bed Mobility: Supine to Sit;Sit to Supine     Supine to sit: Supervision Sit to supine: Supervision   General bed mobility comments: pt OOB in recliner chair upon arrival  Transfers Overall transfer level: Needs assistance Equipment used: None Transfers: Sit to/from Stand Sit to Stand: Supervision         General transfer comment: supervision for safety  Ambulation/Gait Ambulation/Gait assistance: Supervision Ambulation Distance (Feet): 300 Feet Assistive device: None Gait Pattern/deviations: Step-through pattern;Decreased stride length Gait velocity: Decreased Gait velocity interpretation: Below normal speed for age/gender General Gait Details: no instability or LOB, supervision for safety   Stairs Stairs: Yes   Stair Management: One rail Right;Alternating pattern;Forwards Number of Stairs: 12 General stair comments: supervision for safety, no instability or LOB  Wheelchair Mobility    Modified Rankin (Stroke Patients Only)       Balance Overall balance assessment: Needs  assistance Sitting-balance support: No upper extremity supported;Feet supported Sitting balance-Leahy Scale: Good     Standing balance support: No upper extremity supported;During functional activity Standing balance-Leahy Scale: Fair Standing balance comment: Mild unsteadiness, however, no LOB noted.                             Cognition Arousal/Alertness: Awake/alert Behavior During Therapy: WFL for tasks assessed/performed Overall Cognitive Status: Within Functional Limits for tasks assessed                                        Exercises      General Comments        Pertinent Vitals/Pain Pain Assessment: 0-10 Pain Score: 4  Faces Pain Scale: Hurts a little bit Pain Location: neck  Pain Descriptors / Indicators: Aching;Operative site guarding Pain Intervention(s): Monitored during session;Repositioned    Home Living Family/patient expects to be discharged to:: Private residence Living Arrangements: Spouse/significant other Available Help at Discharge: Family;Available 24 hours/day Type of Home: House Home Access: Stairs to enter Entrance Stairs-Rails: None Home Layout: Two level Home Equipment: Shower seat - built in;Hand held shower head Additional Comments: Has two houses one they sleep in and one where they go during the day. About 200 feet to house behind.     Prior Function Level of Independence: Independent          PT Goals (current goals can now be found in the care plan section) Acute Rehab PT Goals Patient Stated Goal: to go home  PT Goal Formulation: With patient Time For Goal Achievement: 12/10/16 Potential  to Achieve Goals: Good Progress towards PT goals: Progressing toward goals    Frequency    Min 5X/week      PT Plan Current plan remains appropriate    Co-evaluation              AM-PAC PT "6 Clicks" Daily Activity  Outcome Measure  Difficulty turning over in bed (including adjusting bedclothes,  sheets and blankets)?: None Difficulty moving from lying on back to sitting on the side of the bed? : None Difficulty sitting down on and standing up from a chair with arms (e.g., wheelchair, bedside commode, etc,.)?: A Little Help needed moving to and from a bed to chair (including a wheelchair)?: None Help needed walking in hospital room?: None Help needed climbing 3-5 steps with a railing? : None 6 Click Score: 23    End of Session Equipment Utilized During Treatment: Cervical collar Activity Tolerance: Patient tolerated treatment well Patient left: in chair;with call bell/phone within reach;with family/visitor present Nurse Communication: Mobility status PT Visit Diagnosis: Other abnormalities of gait and mobility (R26.89);Unsteadiness on feet (R26.81);Pain Pain - part of body:  (neck)     Time: 4098-1191 PT Time Calculation (min) (ACUTE ONLY): 12 min  Charges:  $Gait Training: 8-22 mins                    G Codes:       Preston, [place], [place] [phone]    Alessandra Bevels Taj Nevins 12/04/2016, 9:16 AM

## 2016-12-04 NOTE — Progress Notes (Signed)
Occupational Therapy Evaluation Patient Details Name: Deborah Molina MRN: [id number] DOB: Nov 15, 1960 Today's Date: 12/04/2016    History of Present Illness Pt is a 56 y/o female s/p C4-7 ACDF. Pt with no pertinent PMH.    Clinical Impression   Completed all information regarding ADL and functional mobility while adhering to cervical precautions. Pt also educated on theraputty strengthening ex for L hand. Pt demonstrated understanding. Pt safe to DC home when medically stable. OT signing off.     Follow Up Recommendations  No OT follow up;Supervision - Intermittent    Equipment Recommendations  None recommended by OT    Recommendations for Other Services       Precautions / Restrictions Precautions Precautions: Cervical Precaution Comments: Reviewed cervical precaution handout with pt.  Required Braces or Orthoses: Cervical Brace Cervical Brace: Hard collar Restrictions Weight Bearing Restrictions: No      Mobility Bed Mobility Overal bed mobility: Independent             General bed mobility comments: pt OOB in recliner chair upon arrival  Transfers Independent   Balance Overall balance assessment: Needs assistance Sitting-balance support: No upper extremity supported;Feet supported Sitting balance-Leahy Scale: Good          Independent                     ADL either performed or assessed with clinical judgement   ADL Overall ADL's : Needs assistance/impaired                                     Functional mobility during ADLs: Independent General ADL Comments: Completed education regarding compensatory technqieus for ADL following cervical precautions. Pt verbalized understanding.      Vision         Perception     Praxis      Pertinent Vitals/Pain Pain Assessment: 0-10 Pain Score: 4  Faces Pain Scale: Hurts a little bit Pain Location: neck  Pain Descriptors / Indicators: Aching;Operative site guarding Pain  Intervention(s): Monitored during session;Repositioned     Hand Dominance Left   Extremity/Trunk Assessment Upper Extremity Assessment Upper Extremity Assessment: LUE deficits/detail LUE Deficits / Details: WFL but overall pt complains of decreased gip and pinch strength. States this ha improved dramatically since surgery.   Lower Extremity Assessment Lower Extremity Assessment: Overall WFL for tasks assessed   Cervical / Trunk Assessment Cervical / Trunk Assessment: Other exceptions Cervical / Trunk Exceptions: s/p ACDF   Communication Communication Communication: No difficulties   Cognition Arousal/Alertness: Awake/alert Behavior During Therapy: WFL for tasks assessed/performed Overall Cognitive Status: Within Functional Limits for tasks assessed                                     General Comments       Exercises Exercises: Other exercises Other Exercises Other Exercises: Educated on level 3 theraputty ex for L hand grip/intrinsic hand strengthening. writtent HEP revieded and issued   Shoulder Instructions      Home Living Family/patient expects to be discharged to:: Private residence Living Arrangements: Spouse/significant other Available Help at Discharge: Family;Available 24 hours/day Type of Home: House Home Access: Stairs to enter [employer] of Steps: 1 (threshold step ) Entrance Stairs-Rails: None Home Layout: Two level Alternate Level Stairs-Number of Steps: 12 Alternate Level Stairs-Rails: Right Bathroom Shower/Tub:  Walk-in shower   Bathroom Toilet: Handicapped height     Home Equipment: Shower seat - built in;Hand held shower head   Additional Comments: Has two houses one they sleep in and one where they go during the day. About 200 feet to house behind.       Prior Functioning/Environment Level of Independence: Independent                 OT Problem List: Decreased strength;Decreased knowledge of  precautions;Pain      OT Treatment/Interventions:      OT Goals(Current goals can be found in the care plan section) Acute Rehab OT Goals Patient Stated Goal: to go home  OT Goal Formulation: All assessment and education complete, DC therapy  OT Frequency:     Barriers to D/C:            Co-evaluation              AM-PAC PT "6 Clicks" Daily Activity     Outcome Measure Help from another person eating meals?: None Help from another person taking care of personal grooming?: None Help from another person toileting, which includes using toliet, bedpan, or urinal?: None Help from another person bathing (including washing, rinsing, drying)?: None Help from another person to put on and taking off regular upper body clothing?: None Help from another person to put on and taking off regular lower body clothing?: None 6 Click Score: 24   End of Session Equipment Utilized During Treatment: Cervical collar Nurse Communication: Mobility status  Activity Tolerance: Patient tolerated treatment well Patient left: in chair;with call bell/phone within reach  OT Visit Diagnosis: Muscle weakness (generalized) (M62.81);Pain Pain - part of body:  (neck)                Time: 2956-2130 OT Time Calculation (min): 25 min Charges:  OT General Charges $OT Visit: 1 Visit OT Evaluation $OT Eval Low Complexity: 1 Low OT Treatments $Therapeutic Activity: 8-22 mins G-Codes: OT G-codes **NOT FOR INPATIENT CLASS** Functional Assessment Tool Used: Clinical judgement Functional Limitation: Self care Self Care Current Status (Q6578): At least 1 percent but less than 20 percent impaired, limited or restricted Self Care Goal Status (I6962): At least 1 percent but less than 20 percent impaired, limited or restricted Self Care Discharge Status [phone]): At least 1 percent but less than 20 percent impaired, limited or restricted   [facility], OT/L  [phone] 12/04/2016  Shenice Dolder,HILLARY 12/04/2016, 9:53  AM

## 2016-12-04 NOTE — Progress Notes (Signed)
    Subjective: Procedure(s) (LRB): ACDF C4-7 (N/A) WOUND EXPLORATION 1 Day Post-Op  Patient reports pain as 2 on 0-10 scale.  Reports decreased arm pain reports incisional neck pain   Positive void Negative bowel movement Positive flatus Negative chest pain or shortness of breath  Objective: Vital signs in last 24 hours: Temp:  [97.7 F (36.5 C)-98.6 F (37 C)] 98.6 F (37 C) (09/14 0417) Pulse Rate:  [68-90] 79 (09/14 0417) Resp:  [12-23] 18 (09/14 0417) BP: (101-146)/(40-73) 143/73 (09/14 0417) SpO2:  [89 %-100 %] 98 % (09/14 0417)  Intake/Output from previous day: 09/13 0701 - 09/14 0700 In: 2180 [P.O.:480; I.V.:1700] Out: 917 [Urine:700; Drains:35; Blood:182]  Labs: No results for input(s): WBC, RBC, HCT, PLT in the last 72 hours. No results for input(s): NA, K, CL, CO2, BUN, CREATININE, GLUCOSE, CALCIUM in the last 72 hours. No results for input(s): LABPT, INR in the last 72 hours.  Physical Exam: Neurologically intact ABD soft Intact pulses distally Incision: dressing C/D/I Compartment soft  Assessment/Plan: Patient stable  xrays n/a Mobilization with physical therapy Encourage incentive spirometry Continue care  Advance diet Up with therapy  Patient doing well - minimal drainage noted. No SOB or throat pain.   Ok for d/c to home f/u in 2 weeks Pleased that radicular arm pain markedly improved  Venita Lick, MD [facility] [phone]

## 2016-12-04 NOTE — Care Management Note (Signed)
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   Action/Plan:  No CM needs identified at DC.  Expected Discharge Date:  12/04/16               Expected Discharge Plan:  Home/Self Care  In-House Referral:     Discharge planning Services  CM Consult  Post Acute Care Choice:    Choice offered to:     DME Arranged:    DME Agency:     HH Arranged:    HH Agency:     Status of Service:  Completed, signed off  If discussed at [employer] of Stay Meetings, dates discussed:    Additional Comments:  Lawerance Sabal, RN 12/04/2016, 11:18 AM

## 2016-12-07 ENCOUNTER — Encounter (HOSPITAL_COMMUNITY): Payer: Self-pay | Admitting: Orthopedic Surgery

## 2016-12-08 NOTE — Discharge Summary (Signed)
Physician Discharge Summary  Patient ID: Deborah Molina MRN: [id number] DOB/AGE: 05-25-1960 56 y.o.  Admit date: 12/03/2016 Discharge date: 12/04/16  Admission Diagnoses:  Cervical Spondylitic Radiculopathy  Discharge Diagnoses:  Active Problems:   Status post cervical spinal fusion   Neck pain   Past Medical History:  Diagnosis Date  . Complication of anesthesia    "woke up slow once" had several surgeries since and had not had problems sine then  . Headache   . Hemochromatosis, hereditary (HCC)   . Insulin resistance   . Radiculopathy    spondylotic  . Wears glasses     Surgeries: Procedure(s): ACDF C4-7 WOUND EXPLORATION on 12/03/2016   Consultants (if any):   Discharged Condition: Improved  Hospital Course: Deborah Molina is an 56 y.o. female who was admitted 12/03/2016 with a diagnosis of Cervical Spondylitic Radiculopathy and went to the operating room on 12/03/2016 and underwent the above named procedures.  Post op day one pt reports low level of pain controlled on oral medication.  The pt denies any difficulty with urination.  The pt is ambulating in hallway.  The pt is cleared by PT before DC.   She was given perioperative antibiotics:  Anti-infectives    Start     Dose/Rate Route Frequency Ordered Stop   12/03/16 2000  ceFAZolin (ANCEF) IVPB 2g/100 mL premix     2 g 200 mL/hr over 30 Minutes Intravenous Every 8 hours 12/03/16 1254 12/04/16 0448   12/03/16 0610  ceFAZolin (ANCEF) IVPB 2g/100 mL premix     2 g 200 mL/hr over 30 Minutes Intravenous 30 min pre-op 12/03/16 0610 12/03/16 1143    .  She was given sequential compression devices, early ambulation, and TED for DVT prophylaxis.  She benefited maximally from the hospital stay and there were no complications.    Recent vital signs:  Vitals:   12/04/16 0417 12/04/16 0819  BP: (!) 143/73 123/63  Pulse: 79 77  Resp: 18 16  Temp: 98.6 F (37 C) 98.7 F (37.1 C)  SpO2: 98% 97%    Recent laboratory  studies:  Lab Results  Component Value Date   HGB 14.2 11/26/2016   Lab Results  Component Value Date   WBC 6.9 11/26/2016   PLT 309 11/26/2016   No results found for: INR Lab Results  Component Value Date   NA 138 11/26/2016   K 4.1 11/26/2016   CL 105 11/26/2016   CO2 24 11/26/2016   BUN 14 11/26/2016   CREATININE 0.87 11/26/2016   GLUCOSE 121 (H) 11/26/2016    Discharge Medications:   Allergies as of 12/04/2016      Reactions   Sulfa Antibiotics Hives   Eggs Or Egg-derived Products Swelling, Rash   Milk-related Compounds Swelling, Rash   Yeast-related Products Swelling, Rash      Medication List    STOP taking these medications   OVER THE COUNTER MEDICATION   OVER THE COUNTER MEDICATION   OVER THE COUNTER MEDICATION   OVER THE COUNTER MEDICATION   OVER THE COUNTER MEDICATION   OVER THE COUNTER MEDICATION   OVER THE COUNTER MEDICATION   OVER THE COUNTER MEDICATION   PRESCRIPTION MEDICATION     TAKE these medications   cetirizine 10 MG tablet Commonly known as:  ZYRTEC Take 5 mg by mouth at bedtime.   Coenzyme Q10 100 MG Tabs Take 100 mg by mouth daily.   estradiol 1 MG tablet Commonly known as:  ESTRACE Take 1.5 mg by mouth  daily.   methocarbamol 500 MG tablet Commonly known as:  ROBAXIN Take 1 tablet (500 mg total) by mouth 3 (three) times daily as needed for muscle spasms.   montelukast 10 MG tablet Commonly known as:  SINGULAIR Take 10 mg by mouth daily.   multivitamin with minerals Tabs tablet Take 3 tablets by mouth daily.   NATURE-THROID PO Take 1 capsule by mouth daily. Nature Throid 2 gr   ondansetron 4 MG tablet Commonly known as:  ZOFRAN Take 1 tablet (4 mg total) by mouth every 8 (eight) hours as needed for nausea or vomiting.   oxyCODONE-acetaminophen 10-325 MG tablet Commonly known as:  PERCOCET Take 1 tablet by mouth every 4 (four) hours as needed for pain.   PROBIOTIC DAILY PO Take 1 capsule by mouth daily.  Probiotic 66 billion organisms   progesterone 100 MG capsule Commonly known as:  PROMETRIUM Take 400 mg by mouth at bedtime.   TESTOSTERONE TD Place 0.5 mLs onto the skin daily. Testosterone cream 2% compounded by [facility].   VITAMIN D PO Take 9,000 Units by mouth daily.            Discharge Care Instructions        Start     Ordered   12/03/16 0000  Incentive spirometry RT     12/03/16 1251   12/03/16 0000  oxyCODONE-acetaminophen (PERCOCET) 10-325 MG tablet  Every 4 hours PRN     12/03/16 1306   12/03/16 0000  ondansetron (ZOFRAN) 4 MG tablet  Every 8 hours PRN     12/03/16 1306   12/03/16 0000  methocarbamol (ROBAXIN) 500 MG tablet  3 times daily PRN     12/03/16 1306      Diagnostic Studies: Dg Cervical Spine 2 Or 3 Views  Result Date: 12/03/2016 CLINICAL DATA:  Cervical spine fusion . EXAM: CERVICAL SPINE - 2-3 VIEW; DG C-ARM GT 120 MIN COMPARISON:  No recent prior . FINDINGS: C4 through C7 anterior and interbody fusion. Hardware intact. Anatomic alignment. IMPRESSION: C4 through C7 anterior interbody fusion with anatomic alignment . Electronically Signed   By: Maisie Fus  Register   On: 12/03/2016 11:54   Dg C-arm Gt 120 Min  Result Date: 12/03/2016 CLINICAL DATA:  Cervical spine fusion . EXAM: CERVICAL SPINE - 2-3 VIEW; DG C-ARM GT 120 MIN COMPARISON:  No recent prior . FINDINGS: C4 through C7 anterior and interbody fusion. Hardware intact. Anatomic alignment. IMPRESSION: C4 through C7 anterior interbody fusion with anatomic alignment . Electronically Signed   By: Maisie Fus  Register   On: 12/03/2016 11:54    Disposition: 01-Home or Self Care Pt will present to clinic in 2 weeks Post op medication  Was provided for the pt  Discharge Instructions    Incentive spirometry RT    Complete by:  As directed       Follow-up Information    Venita Lick, MD. Schedule an appointment as soon as possible for a visit in 2 week(s).   Specialty:  Orthopedic  Surgery Why:  If symptoms worsen, For suture removal, For wound re-check Contact information: [address] [place] [address] [phone]            Signed: Kirt Boys 12/08/2016, 1:25 PM

## 2018-11-20 IMAGING — RF DG CERVICAL SPINE 2 OR 3 VIEWS
1 series · 3 of 3 positions shown · non-contrast
Comparison: No recent prior .

CLINICAL DATA: Cervical spine fusion .

EXAM:
CERVICAL SPINE - 2-3 VIEW; DG C-ARM GT 120 MIN

[Series 1: run · 3 of 3 slices shown]
[im 1/3]
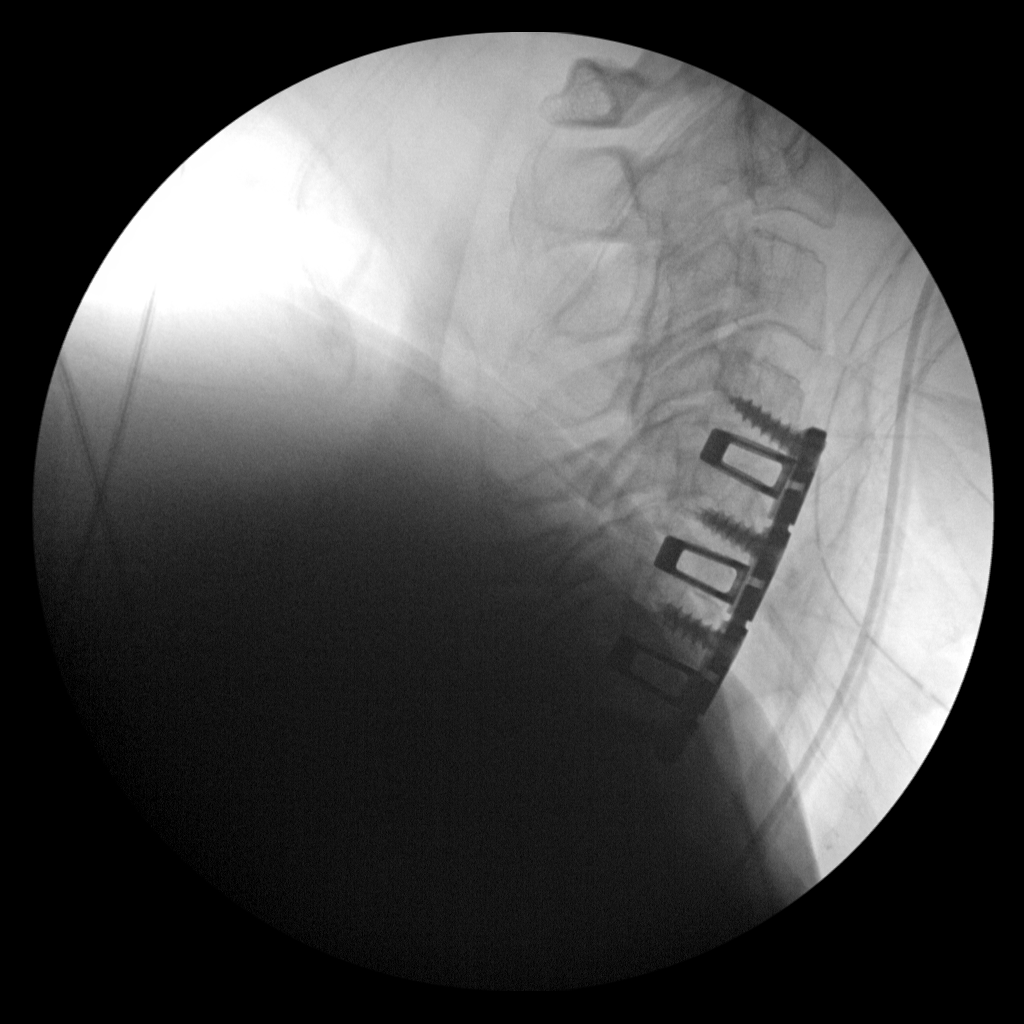
[im 2/3]
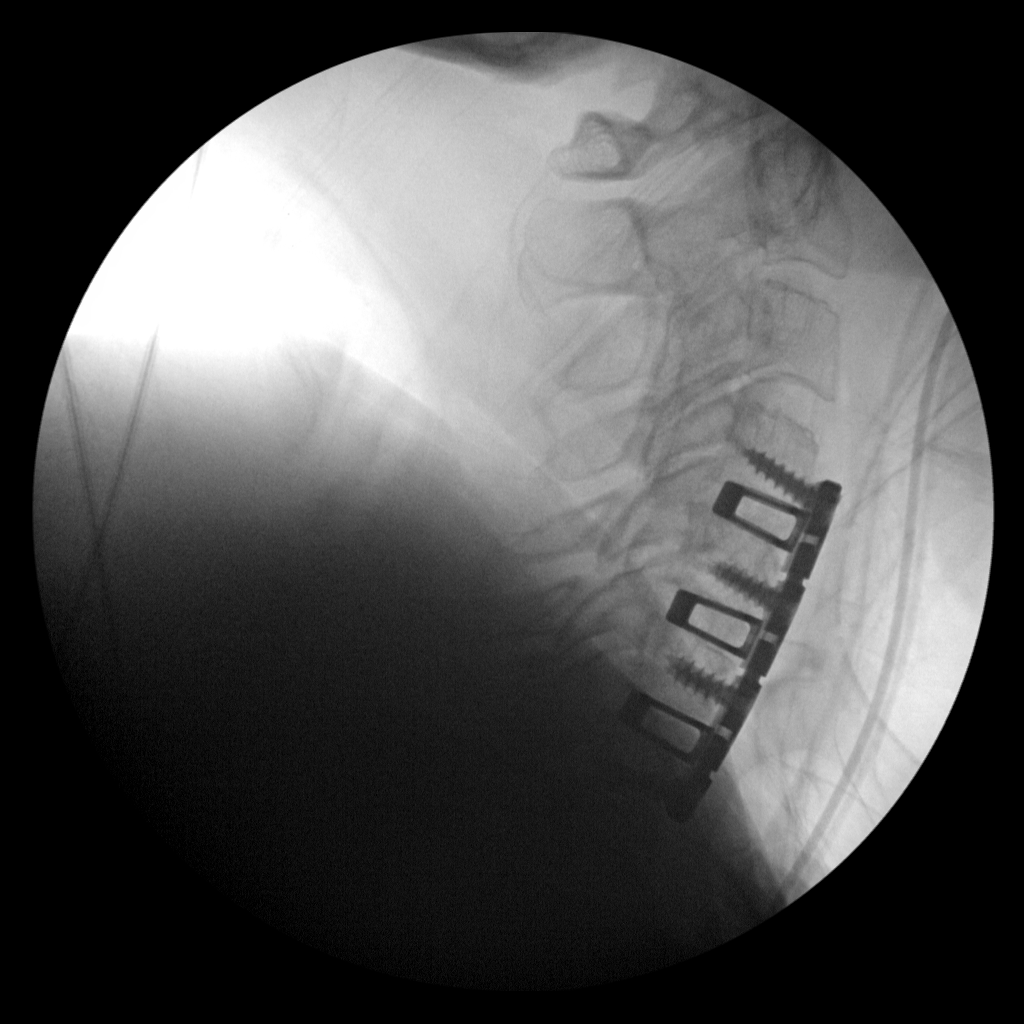
[im 3/3]
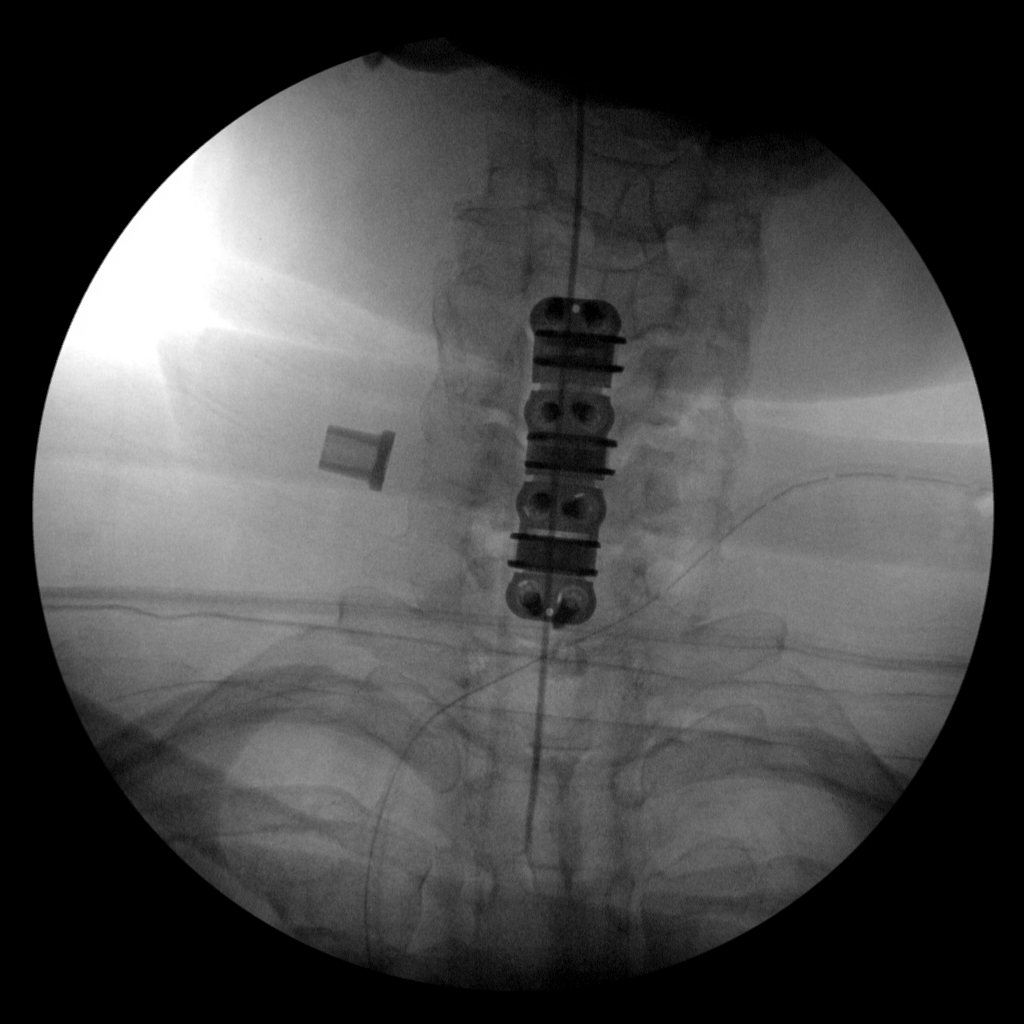

[3 of 3 positions shown; findings below may reference images not displayed]

FINDINGS: C4 through C7 anterior and interbody fusion. Hardware intact.
Anatomic alignment.
IMPRESSION: C4 through C7 anterior interbody fusion with anatomic alignment .

## 2021-03-05 ENCOUNTER — Institutional Professional Consult (permissible substitution): Payer: BLUE CROSS/BLUE SHIELD | Admitting: Plastic Surgery

## 2022-07-31 ENCOUNTER — Other Ambulatory Visit: Payer: Self-pay | Admitting: Orthopedic Surgery

## 2022-07-31 DIAGNOSIS — M542 Cervicalgia: Secondary | ICD-10-CM

## 2022-08-10 ENCOUNTER — Ambulatory Visit
Admission: RE | Admit: 2022-08-10 | Discharge: 2022-08-10 | Disposition: A | Discharge status: Home / Self Care | Payer: BC Managed Care – PPO | Source: Ambulatory Visit | Attending: Orthopedic Surgery | Admitting: Orthopedic Surgery

## 2022-08-10 DIAGNOSIS — M542 Cervicalgia: Secondary | ICD-10-CM
